# Patient Record
Sex: Female | Born: 1965 | Race: Black or African American | Hispanic: No | Marital: Married | Smoking: Never smoker
Health system: Southern US, Community
[De-identification: ages and names within clinical notes are randomized; demographics above are authoritative.]

## PROBLEM LIST (undated history)

## (undated) DIAGNOSIS — T7840XA Allergy, unspecified, initial encounter: Secondary | ICD-10-CM

## (undated) HISTORY — DX: Allergy, unspecified, initial encounter: T78.40XA

## (undated) HISTORY — PX: OTHER SURGICAL HISTORY: SHX169

---

## 2002-06-18 ENCOUNTER — Other Ambulatory Visit: Admission: RE | Admit: 2002-06-18 | Discharge: 2002-06-18 | Payer: Self-pay | Admitting: Obstetrics and Gynecology

## 2003-11-29 ENCOUNTER — Other Ambulatory Visit: Admission: RE | Admit: 2003-11-29 | Discharge: 2003-11-29 | Payer: Self-pay | Admitting: Pediatrics

## 2012-03-24 ENCOUNTER — Emergency Department (HOSPITAL_COMMUNITY): Payer: BC Managed Care – PPO

## 2012-03-24 ENCOUNTER — Emergency Department (HOSPITAL_COMMUNITY)
Admission: EM | Admit: 2012-03-24 | Discharge: 2012-03-24 | Disposition: A | Discharge status: Home / Self Care | Payer: BC Managed Care – PPO | Attending: Emergency Medicine | Admitting: Emergency Medicine

## 2012-03-24 ENCOUNTER — Encounter (HOSPITAL_COMMUNITY): Payer: Self-pay

## 2012-03-24 DIAGNOSIS — R51 Headache: Secondary | ICD-10-CM | POA: Insufficient documentation

## 2012-03-24 DIAGNOSIS — R42 Dizziness and giddiness: Secondary | ICD-10-CM | POA: Insufficient documentation

## 2012-03-24 DIAGNOSIS — R110 Nausea: Secondary | ICD-10-CM | POA: Insufficient documentation

## 2012-03-24 MED ORDER — OXYMETAZOLINE HCL 0.05 % NA SOLN: 2.0000 | Freq: Two times a day (BID) | NASAL | Status: AC

## 2012-03-24 MED ORDER — FLUTICASONE PROPIONATE 50 MCG/ACT NA SUSP: 2.0000 | Freq: Every day | NASAL | Status: AC

## 2012-03-24 MED ORDER — LORAZEPAM 1 MG PO TABS
1.0000 mg | ORAL_TABLET | Freq: Once | ORAL | Status: AC
  Administered 2012-03-24: 1 mg via ORAL
  Filled 2012-03-24: qty 1

## 2012-03-24 MED ORDER — ONDANSETRON 8 MG PO TBDP: 8.0000 mg | ORAL_TABLET | Freq: Three times a day (TID) | ORAL | Status: AC | PRN

## 2012-03-24 MED ORDER — LORAZEPAM 1 MG PO TABS: 1.0000 mg | ORAL_TABLET | Freq: Three times a day (TID) | ORAL | Status: AC | PRN

## 2012-03-24 MED ORDER — MECLIZINE HCL 25 MG PO TABS
50.0000 mg | ORAL_TABLET | Freq: Once | ORAL | Status: AC
  Administered 2012-03-24: 50 mg via ORAL
  Filled 2012-03-24: qty 2

## 2012-03-24 MED ORDER — ONDANSETRON 4 MG PO TBDP
8.0000 mg | ORAL_TABLET | Freq: Once | ORAL | Status: AC
  Administered 2012-03-24: 8 mg via ORAL
  Filled 2012-03-24: qty 2

## 2012-03-24 NOTE — ED Notes (Signed)
Pt presents with 1 week h/o dizziness, pt describes as a spinning.  +nausea; pt reports "sinus pain".

## 2012-03-24 NOTE — ED Provider Notes (Signed)
Cynthia Gibson is a 46 y.o. female who complains of dizziness, a spinning sensation, ongoing for 2 weeks, with trouble walking. She also has a popping sensation in her sinuses. On exam, she is alert, and cooperative. No truncal ataxia while sitting. No nystagmus. Normal strength and sensation.  Symptoms are nonspecific. I doubt posterior circulation stroke. We'll continue to treat symptomatically and evaluate further with a CT head. I believe that if there is no significant brain problem on the CT scan she can safely  be discharged home. Medical screening examination/treatment/procedure(s) were conducted as a shared visit with non-physician practitioner(s) and myself.  I personally evaluated the patient during the encounter  Flint Melter, MD 03/28/12 1045

## 2012-03-24 NOTE — Discharge Instructions (Signed)
Please read and follow all provided instructions.  Your diagnoses today include:  1. Vertigo     Tests performed today include:  CT head which was normal  Vital signs. See below for your results today.   Medications prescribed:   Ativan - medicine for vertigo  DO NOT drive or perform any activities that require you to be awake and alert because this medicine can make you drowsy.    Zofran (ondansetron) - for nausea and vomiting  Take any prescribed medications only as directed.  Home care instructions:  Follow any educational materials contained in this packet.  Follow-up instructions: Please follow-up with your primary care provider in the next 3 days for further evaluation of your symptoms. If you do not have a primary care doctor -- see below for referral information.   Return instructions:   Please return to the Emergency Department if you experience worsening symptoms.   Please return if you have any other emergent concerns.  Additional Information:  Your vital signs today were: BP 110/73  Pulse 75  Temp 98.7 F (37.1 C) (Oral)  Resp 20  Ht 5\' 7"  (1.702 m)  Wt 173 lb (78.472 kg)  BMI 27.10 kg/m2  SpO2 98%  LMP 03/02/2012 If your blood pressure (BP) was elevated above 135/85 this visit, please have this repeated by your doctor within one month. -------------- No Primary Care Doctor Call [facility]  [phone] Other agencies that provide inexpensive medical care    [facility]  [phone]    [facility]  [phone]    [facility]  [phone]    [facility]  [phone]    [facility]  [phone]    [facility]  [phone] -------------- RESOURCE GUIDE:  Dental Problems  Patients with Medicaid: [facility] [phone]                                            [phone] Phone:  [phone]                                                    Phone:  [phone]  If unable to pay or uninsured, contact:  [facility] or [facility]. to become qualified for the adult dental clinic.  Chronic Pain Problems Contact [facility]  [phone] Patients need to be referred by their primary care doctor.  Insufficient Money for Medicine Contact [employer]:  call "211" or [facility] [phone].  Psychological Services [facility]  [phone] [facility]  [phone] [facility]   [phone] (emergency services [phone])  Substance Abuse Resources [facility]  [phone] [facility] [phone] [facility] [place] [phone] Floydene Flock [phone] Residential & Outpatient Substance Abuse Program  [phone]  Abuse/Neglect [facility] Child Abuse Hotline [phone] [facility] Child Abuse Hotline [phone] (After Hours)  Emergency Shelter [facility] [phone]  Maternity Homes Room at the [place] [facility] [place] [phone] [facility] [phone]  [place]  [employer]  [facility]     [employer]                          [facility] Dept. [address] [place]                       [address]      [address]  Blondell Reveal Phone:  [phone]                                   Phone:  [phone]                 Phone:  [phone]  [facility] Phone:  [phone]  [facility] Child Abuse Hotline [phone] (After Hours)

## 2012-03-24 NOTE — ED Notes (Signed)
Pt. Up to bathroom. States that she vomited x1

## 2012-03-24 NOTE — ED Provider Notes (Signed)
History     CSN: [id number]  Arrival date & time 03/24/12  4098   First MD Initiated Contact with Patient 03/24/12 [id number]      Chief Complaint  Patient presents with  . Dizziness    (Consider location/radiation/quality/duration/timing/severity/associated sxs/prior treatment) HPI Comments: Patient reports complaining of "dizzy spells" described as spinning for the last two weeks that have gotten progressively worse last night and this morning. She reports that last week the spells would come once a day or so and that she could get them under control quickly. Last night, the patient had another spell where she felt very dizzy, lightheaded and nauseous. She reports that she tried to tilt her head back and sit down, but had no relief for over 30 minutes, and decided to go to bed. She woke up this morning with the same dizziness and nausea. She tried to lay back down but again had no relief of the dizziness and nausea for over 30 minutes. She also reports some sinus pressure and headache during the spells. She has not tried any medication for the spells. The patient reports that during the spells she is very unsteady on her feet and feels the need to sit down. Patient denies fever, cough, CP and SOB. She also denies head trauma or loss of consciousness. She does report some numbness and tingling in her extremities during the spells. Patient was seen yesterday by [facility] and diagnosed with a urinary tract infection, but has not filled the prescription for antibiotics yet. Blood was also drawn but she does not know the results. Onset was acute. Course is intermittent. Nothing makes symptoms better or worse.   The history is provided by the patient.    History reviewed. No pertinent past medical history.  History reviewed. No pertinent past surgical history.  No family history on file.  History  Substance Use Topics  . Smoking status: Never Smoker   . Smokeless tobacco: Not on file  . Alcohol  Use: Yes    OB History    Grav Para Term Preterm Abortions TAB SAB Ect Mult Living                  Review of Systems  Constitutional: Negative for fever.  HENT: Positive for sinus pressure. Negative for sore throat and rhinorrhea.   Eyes: Negative for redness.  Respiratory: Negative for cough.   Cardiovascular: Negative for chest pain.  Gastrointestinal: Positive for nausea. Negative for vomiting, abdominal pain and diarrhea.  Genitourinary: Negative for dysuria.  Musculoskeletal: Negative for myalgias.  Skin: Negative for rash.  Neurological: Positive for dizziness (Spinning) and headaches. Negative for syncope, weakness, light-headedness and numbness.    Allergies  Review of patient's allergies indicates no known allergies.  Home Medications   Current Outpatient Rx  Name Route Sig Dispense Refill  . ADULT MULTIVITAMIN W/MINERALS CH Oral Take 1 tablet by mouth daily.      BP 129/68  Pulse 83  Temp 98.3 F (36.8 C) (Oral)  Resp 20  Ht 5\' 7"  (1.702 m)  Wt 173 lb (78.472 kg)  BMI 27.10 kg/m2  SpO2 100%  LMP 03/02/2012  Physical Exam  Nursing note and vitals reviewed. Constitutional: She appears well-developed and well-nourished.  HENT:  Head: Normocephalic and atraumatic.  Right Ear: External ear normal.  Left Ear: External ear normal.  Nose: Nose normal.  Mouth/Throat: Oropharynx is clear and moist. No oropharyngeal exudate.  Eyes: Conjunctivae and EOM are normal. Pupils are equal, round, and reactive  to light. Right eye exhibits no discharge. Left eye exhibits no discharge. Right eye exhibits normal extraocular motion and no nystagmus. Left eye exhibits normal extraocular motion and no nystagmus.  Neck: Normal range of motion. Neck supple.  Cardiovascular: Normal rate, regular rhythm and normal heart sounds.   Pulmonary/Chest: Effort normal and breath sounds normal. No respiratory distress.  Abdominal: Soft. There is no tenderness.  Musculoskeletal: She  exhibits no edema and no tenderness.  Neurological: She is alert. She has normal strength and normal reflexes. No cranial nerve deficit or sensory deficit. Gait (Patient unable to stand without assistance) abnormal. Coordination normal. GCS eye subscore is 4. GCS verbal subscore is 5. GCS motor subscore is 6.  Skin: Skin is warm and dry.  Psychiatric: She has a normal mood and affect.    ED Course  Procedures (including critical care time)  Labs Reviewed - No data to display Ct Head Wo Contrast  03/24/2012  *RADIOLOGY REPORT*  Clinical Data:  Headache and vertigo  CT HEAD WITHOUT CONTRAST  Technique:  Contiguous axial images were obtained from the base of the skull through the vertex without contrast  Comparison:  None.  Findings:  The brain has a normal appearance without evidence for hemorrhage, acute infarction, hydrocephalus, or mass lesion.  There is no extra axial fluid collection.  The skull and paranasal sinuses are normal.  IMPRESSION: Normal CT of the head without contrast.  Original Report Authenticated By: Camelia Phenes, M.D.     1. Vertigo     9:35 AM Patient seen and examined. Medications ordered.   Vital signs reviewed and are as follows: Filed Vitals:   03/24/12 0843  BP: 129/68  Pulse: 83  Temp: 98.3 F (36.8 C)  Resp: 20   1:47 PM Patient has been seen by Dr. Effie Shy. Will CT head given no improvement with meclizine/ativan. Patient continues to have difficulty with ambulation.   CT neg. Symptoms not improved with treatment. Given subjective symptoms, will treat sinusitis. Patient will be given ativan for dizziness. She was counseled on safe use of this medication. Will also give zofran for nausea at patient request.   Neurology referral given for eval of vertigo. Also urged close f/u with PCP and return if symptoms worse or change.   Patient and husband verbalize understanding and agree with plan.    MDM  Vertigo -- do not suspect central vertigo. Minimal  improvement with treatment. Given lack of risk factors, as well as waxing and waning/intermittent severity of severity of symptoms -- do not think this is a posterior circulation event. Do not think MRI is indicated at this time. She is otherwise neurologically intact. She is safe for discharge and follow-up with PCP and neuro.         [place], [place] 03/24/12 1658

## 2012-03-24 NOTE — ED Notes (Signed)
Pt has been experiencing episodes of dizziness and "room spinning". She feels nauseous and has sinus pressure. Also reports she was treated yesterday for UTI but has not picked up antibiotics yet.

## 2012-10-12 ENCOUNTER — Ambulatory Visit: Payer: BC Managed Care – PPO | Admitting: Obstetrics and Gynecology

## 2012-10-12 ENCOUNTER — Encounter: Payer: Self-pay | Admitting: Obstetrics and Gynecology

## 2012-10-12 VITALS — BP 112/64 | HR 68 | Resp 16 | Ht 67.0 in | Wt 180.0 lb

## 2012-10-12 DIAGNOSIS — N3281 Overactive bladder: Secondary | ICD-10-CM | POA: Insufficient documentation

## 2012-10-12 DIAGNOSIS — Z124 Encounter for screening for malignant neoplasm of cervix: Secondary | ICD-10-CM

## 2012-10-12 DIAGNOSIS — Z139 Encounter for screening, unspecified: Secondary | ICD-10-CM

## 2012-10-12 DIAGNOSIS — R350 Frequency of micturition: Secondary | ICD-10-CM

## 2012-10-12 LAB — CBC
HCT: 33.6 % — ABNORMAL LOW (ref 36.0–46.0)
Hemoglobin: 10.9 g/dL — ABNORMAL LOW (ref 12.0–15.0)
MCH: 27.1 pg (ref 26.0–34.0)
MCV: 83.6 fL (ref 78.0–100.0)
RBC: 4.02 MIL/uL (ref 3.87–5.11)
WBC: 4.6 10*3/uL (ref 4.0–10.5)

## 2012-10-12 LAB — POCT URINALYSIS DIPSTICK
Ketones, UA: NEGATIVE
Spec Grav, UA: <=1.005
Urobilinogen, UA: NEGATIVE
pH, UA: 7

## 2012-10-12 NOTE — Progress Notes (Signed)
Contraception Comdom Last pap 05/16/2008 Wnl Last Mammo 2013 WNL Last Colonoscopy None Last Dexa Scan None Primary MD [name] Abuse at Home None  C/o difficulty losing weight and frequency at night  Filed Vitals:   10/12/12 1500  BP: 112/64  Pulse: 68  Resp: 16   ROS: noncontributory  Physical Examination: General appearance - alert, well appearing, and in no distress Neck - supple, no significant adenopathy Chest - clear to auscultation, no wheezes, rales or rhonchi, symmetric air entry Heart - normal rate and regular rhythm Abdomen - soft, nontender, nondistended, no masses or organomegaly Breasts - breasts appear normal, no suspicious masses, no skin or nipple changes or axillary nodes Pelvic - normal external genitalia, vulva, vagina, cervix, uterus and adnexa Back exam - no CVAT Extremities - no edema, redness or tenderness in the calves or thighs  A/P Discussed wt loss Check labs - tsh, cbc and vit d Fasting glucose lab visit only Trial of vesicare RTO 3-26mths to f/u vesicare Kegel exercises

## 2012-10-13 LAB — PAP IG W/ RFLX HPV ASCU

## 2012-10-14 LAB — URINE CULTURE: Colony Count: NO GROWTH

## 2012-10-16 ENCOUNTER — Telehealth: Payer: Self-pay

## 2012-10-16 NOTE — Telephone Encounter (Signed)
Left message for pt to return call. Pt need to start iron supp, low hgb. Mathis Bud

## 2012-10-17 ENCOUNTER — Other Ambulatory Visit: Payer: BC Managed Care – PPO

## 2012-10-17 DIAGNOSIS — Z139 Encounter for screening, unspecified: Secondary | ICD-10-CM

## 2012-10-23 ENCOUNTER — Telehealth: Payer: Self-pay

## 2012-10-23 NOTE — Telephone Encounter (Signed)
LM for pt that all labs looked good except low iron level of 10.9. Iron supplement 325 mg Ferrous sulfate recommended daily. Melody Comas A

## 2013-08-07 IMAGING — CT CT HEAD W/O CM
1 series · 16 of 30 positions shown, 20 images · non-contrast
Comparison: None.

CLINICAL DATA: Headache and vertigo

CT HEAD WITHOUT CONTRAST
TECHNIQUE: Contiguous axial images were obtained from the base of
the skull through the vertex without contrast

[Series 2: head routine 4.8 h37s · axial · 0.43mm/px · z∈[-93,+35]mm · 16 of 30 slices shown, 20 images]
[im 2/30  brain]
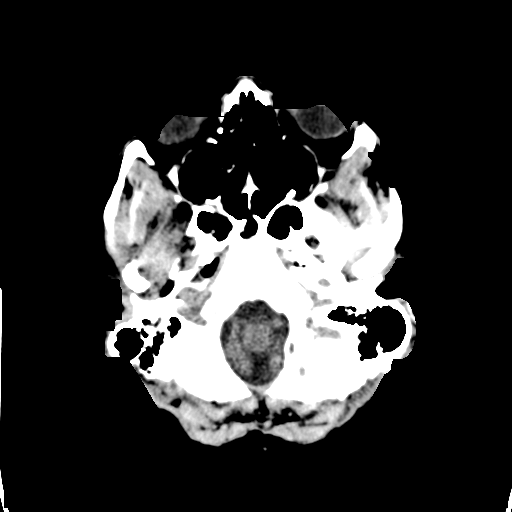
[im 2/30  bone]
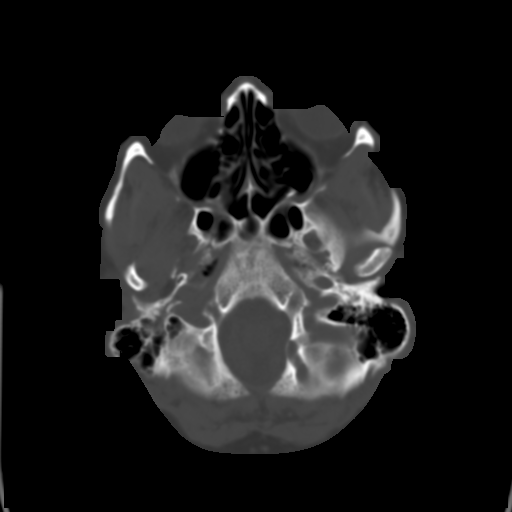
[im 4/30  brain]
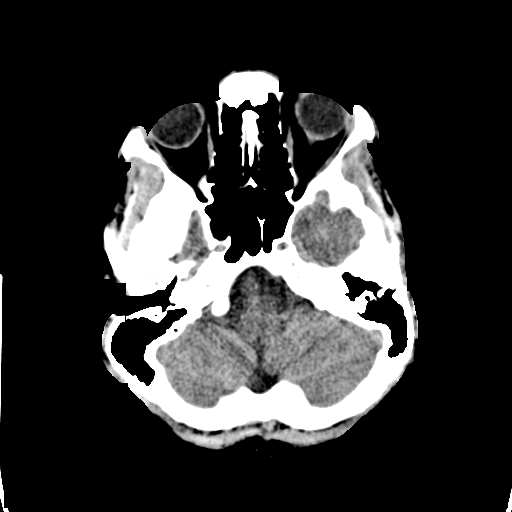
[im 6/30  brain]
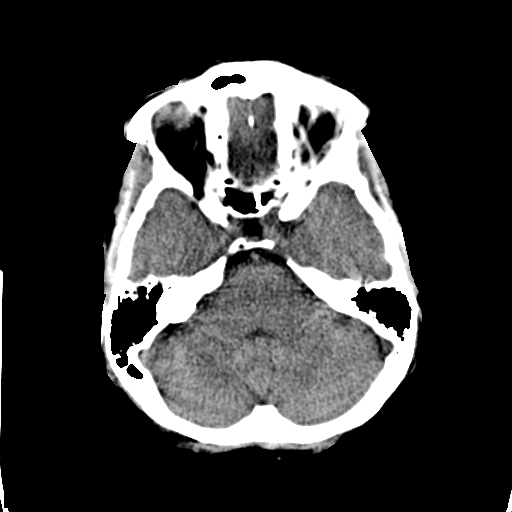
[im 8/30  brain]
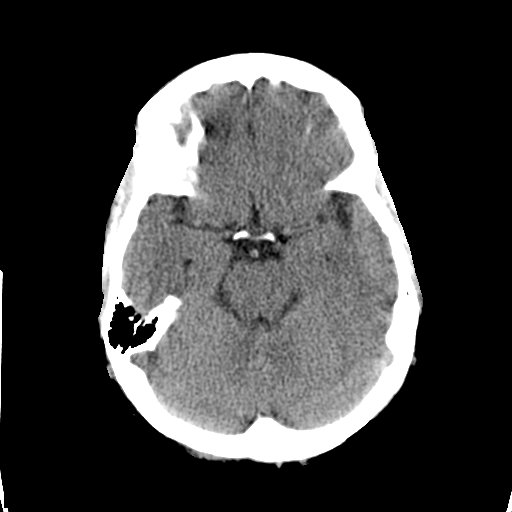
[im 9/30  brain]
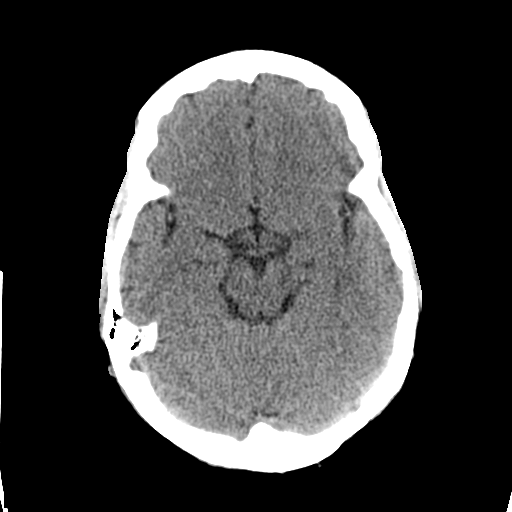
[im 9/30  bone]
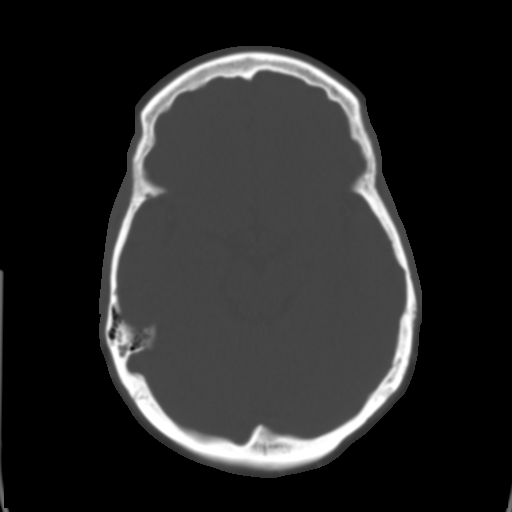
[im 11/30  brain]
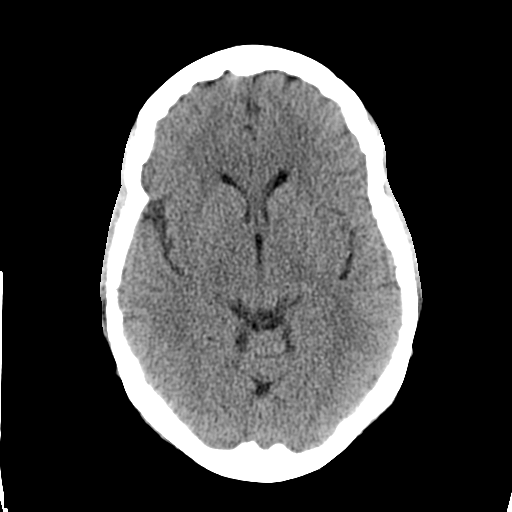
[im 13/30  brain]
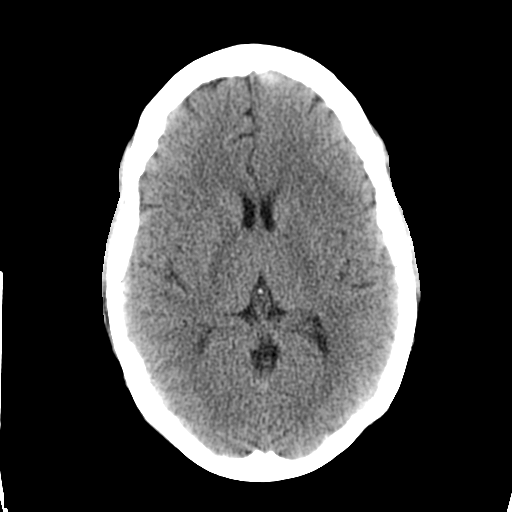
[im 15/30  brain]
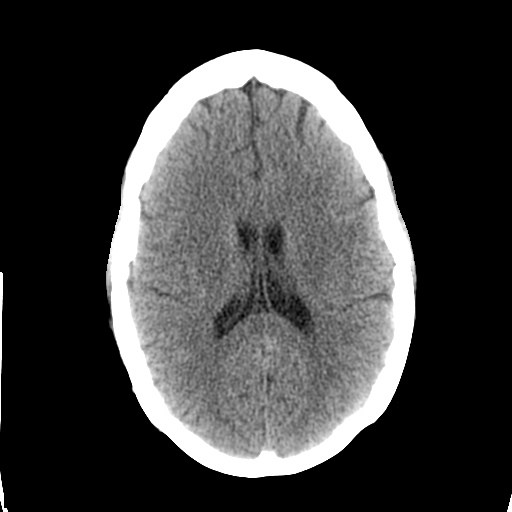
[im 16/30  brain]
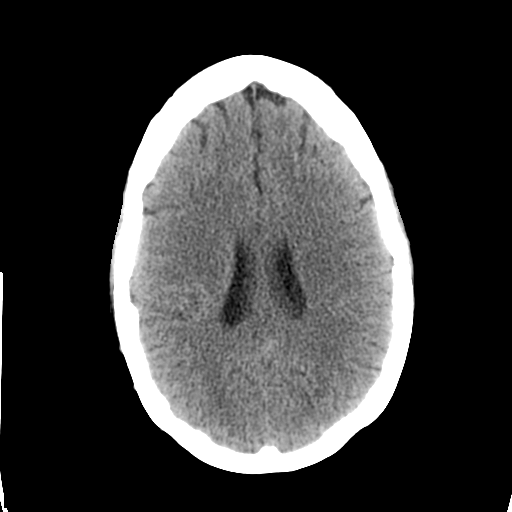
[im 16/30  bone]
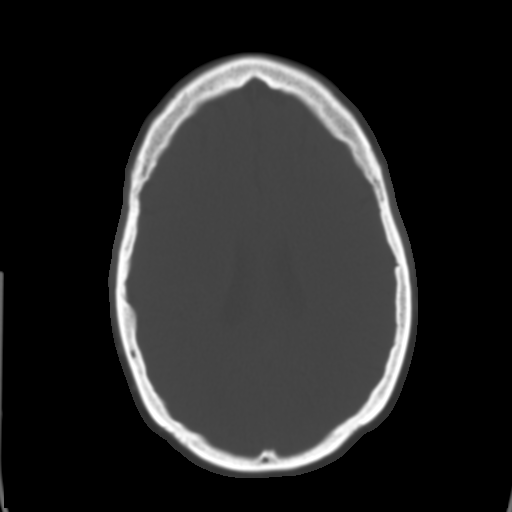
[im 18/30  brain]
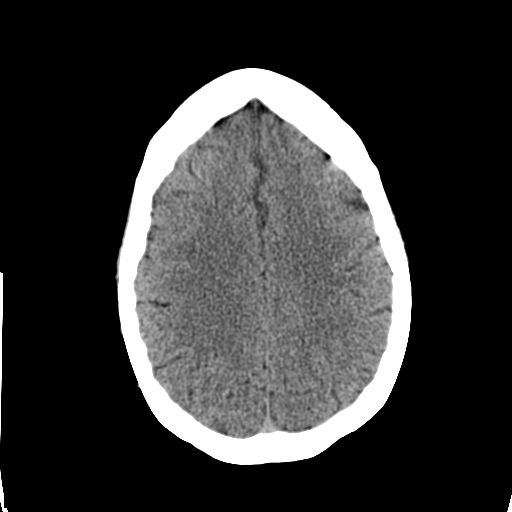
[im 20/30  brain]
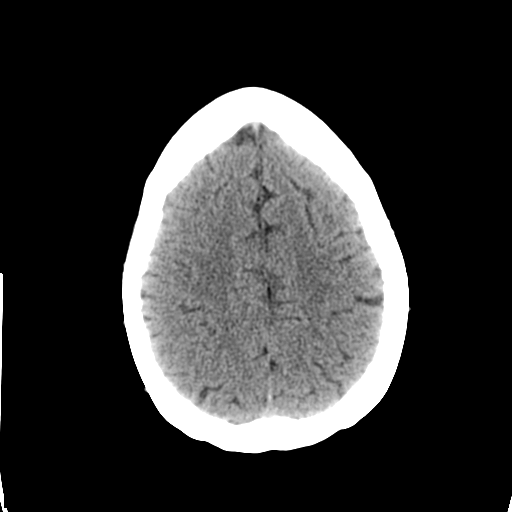
[im 22/30  brain]
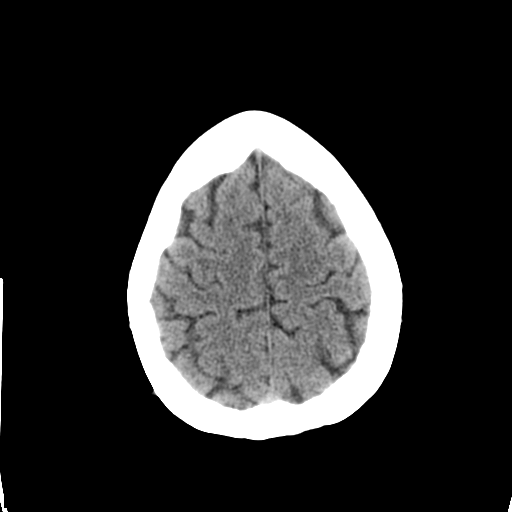
[im 23/30  brain]
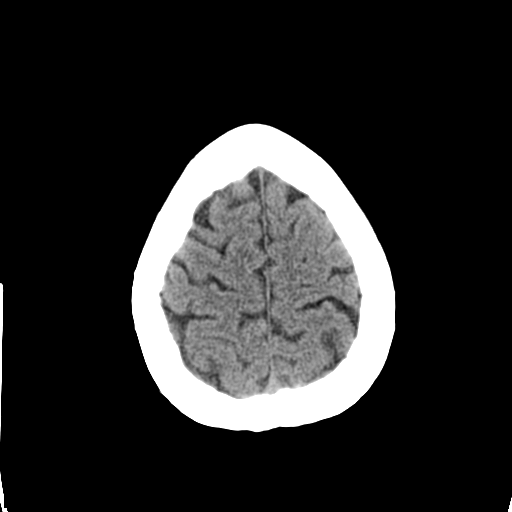
[im 23/30  bone]
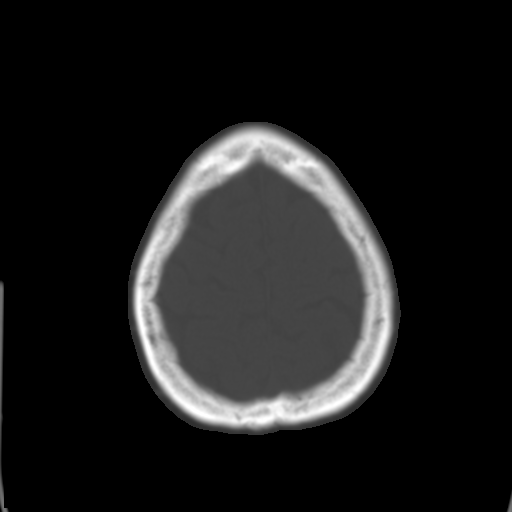
[im 25/30  brain]
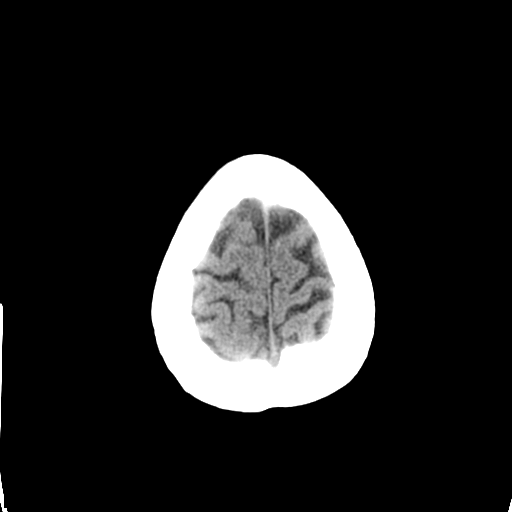
[im 27/30  brain]
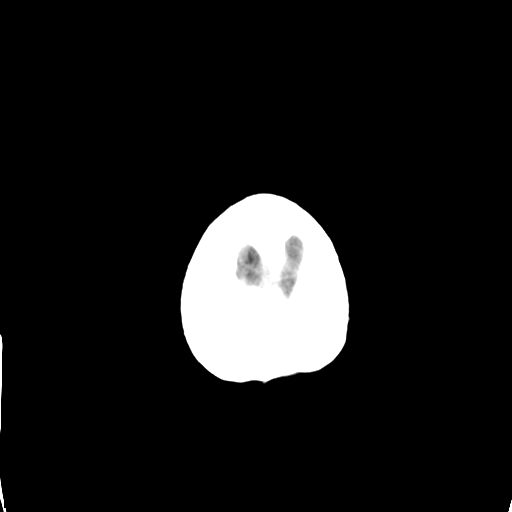
[im 29/30  brain]
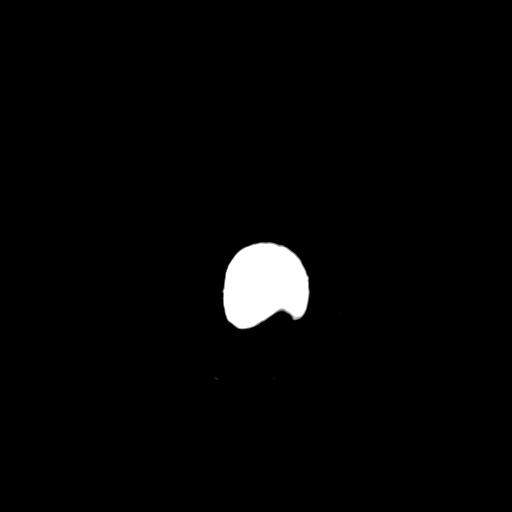

[16 of 30 positions shown; findings below may reference images not displayed]

FINDINGS: The brain has a normal appearance without evidence for
hemorrhage, acute infarction, hydrocephalus, or mass lesion.  There
is no extra axial fluid collection.  The skull and paranasal
sinuses are normal.
IMPRESSION: Normal CT of the head without contrast.

## 2014-07-29 ENCOUNTER — Encounter: Payer: Self-pay | Admitting: Obstetrics and Gynecology

## 2016-10-04 ENCOUNTER — Other Ambulatory Visit: Payer: Self-pay | Admitting: Family Medicine

## 2016-10-04 ENCOUNTER — Other Ambulatory Visit (HOSPITAL_COMMUNITY)
Admission: RE | Admit: 2016-10-04 | Discharge: 2016-10-04 | Disposition: A | Discharge status: Home / Self Care | Payer: BC Managed Care – PPO | Source: Ambulatory Visit | Attending: Family Medicine | Admitting: Family Medicine

## 2016-10-04 DIAGNOSIS — Z01419 Encounter for gynecological examination (general) (routine) without abnormal findings: Secondary | ICD-10-CM | POA: Diagnosis present

## 2016-10-04 DIAGNOSIS — Z1151 Encounter for screening for human papillomavirus (HPV): Secondary | ICD-10-CM | POA: Diagnosis not present

## 2016-10-07 LAB — CYTOLOGY - PAP
DIAGNOSIS: NEGATIVE
HPV (WINDOPATH): NOT DETECTED

## 2019-11-08 ENCOUNTER — Ambulatory Visit: Payer: BC Managed Care – PPO | Attending: Family

## 2019-11-08 DIAGNOSIS — Z23 Encounter for immunization: Secondary | ICD-10-CM | POA: Insufficient documentation

## 2019-11-09 NOTE — Progress Notes (Signed)
   Covid-19 Vaccination Clinic  Name:  Cynthia Gibson    MRN: [id number] DOB: Oct 17, 1965  11/09/2019  [name] was observed post Covid-19 immunization for 15 minutes without incidence. She was provided with Vaccine Information Sheet and instruction to access the V-Safe system.   [name] was instructed to call 911 with any severe reactions post vaccine: Marland Kitchen Difficulty breathing  . Swelling of your face and throat  . A fast heartbeat  . A bad rash all over your body  . Dizziness and weakness    Immunizations Administered    Name Date Dose VIS Date Route   Moderna COVID-19 Vaccine 11/08/2019  5:31 PM 0.5 mL 08/28/2019 Intramuscular   Manufacturer: Moderna   Lot: [id number]   NDC: 76808-811-03

## 2019-12-11 ENCOUNTER — Ambulatory Visit: Payer: BC Managed Care – PPO | Attending: Family

## 2019-12-11 DIAGNOSIS — Z23 Encounter for immunization: Secondary | ICD-10-CM

## 2019-12-11 NOTE — Progress Notes (Signed)
   Covid-19 Vaccination Clinic  Name:  LORYN HAACKE    MRN: [id number] DOB: Jul 18, 1966  12/11/2019  Ms. Pennebaker was observed post Covid-19 immunization for 15 minutes without incident. She was provided with Vaccine Information Sheet and instruction to access the V-Safe system.   Ms. Marcano was instructed to call 911 with any severe reactions post vaccine: Marland Kitchen Difficulty breathing  . Swelling of face and throat  . A fast heartbeat  . A bad rash all over body  . Dizziness and weakness   Immunizations Administered    Name Date Dose VIS Date Route   Moderna COVID-19 Vaccine 12/11/2019  1:08 PM 0.5 mL 08/28/2019 Intramuscular   Manufacturer: Moderna   Lot: [id number]   NDC: 00164-290-37

## 2020-08-18 ENCOUNTER — Ambulatory Visit: Payer: BC Managed Care – PPO | Attending: Family

## 2020-08-18 DIAGNOSIS — Z23 Encounter for immunization: Secondary | ICD-10-CM

## 2020-12-02 NOTE — Progress Notes (Signed)
   Covid-19 Vaccination Clinic  Name:  Cynthia Gibson    MRN: [id number] DOB: 1965/12/28  12/02/2020  [name] was observed post Covid-19 immunization for 15 minutes without incident. She was provided with Vaccine Information Sheet and instruction to access the V-Safe system.   [name] was instructed to call 911 with any severe reactions post vaccine: Marland Kitchen Difficulty breathing  . Swelling of face and throat  . A fast heartbeat  . A bad rash all over body  . Dizziness and weakness   Immunizations Administered    Name Date Dose VIS Date Route   Moderna Covid-19 Booster Vaccine 08/18/2020  2:30 PM 0.25 mL 07/16/2020 Intramuscular   Manufacturer: Moderna   Lot: [id number]   NDC: 62947-654-65

## 2021-09-09 ENCOUNTER — Ambulatory Visit: Payer: Self-pay | Attending: Family

## 2021-09-09 DIAGNOSIS — Z23 Encounter for immunization: Secondary | ICD-10-CM

## 2021-09-09 NOTE — Progress Notes (Signed)
° °  Covid-19 Vaccination Clinic  Name:  WINRY EGNEW    MRN: [id number] DOB: 1966-07-24  09/09/2021  Ms. Hansell was observed post Covid-19 immunization for 15 minutes without incident. She was provided with Vaccine Information Sheet and instruction to access the V-Safe system.   Ms. Ericson was instructed to call 911 with any severe reactions post vaccine: Difficulty breathing  Swelling of face and throat  A fast heartbeat  A bad rash all over body  Dizziness and weakness   Immunizations Administered     Name Date Dose VIS Date Route   Moderna Covid-19 vaccine Bivalent Booster 09/09/2021  3:00 PM 0.5 mL 05/09/2021 Intramuscular   Manufacturer: Moderna   Lot: [id number]   NDC: 89791-504-13

## 2023-10-17 ENCOUNTER — Other Ambulatory Visit: Payer: Self-pay

## 2023-10-17 DIAGNOSIS — I872 Venous insufficiency (chronic) (peripheral): Secondary | ICD-10-CM

## 2023-10-26 NOTE — Progress Notes (Unsigned)
Office Note     CC:  Varicose veins Requesting Provider:  Jamey Reas, P*  HPI: Cynthia Gibson is a 58 y.o. (11/07/1965) female presenting at the request of .[name], MD for bilateral lower extremity varicose veins.  On exam, due to [place] was doing well.  Originally from [place], she moved down with family decades ago.  She has worked in [employer] for the last 30 years.  She has 2 children, both of which are also working [employer].  She has appreciated spider veins on her lower extremities for decades.  She denies significant edema, pain, heaviness.  Her legs look the same at the end of the day as they do in the morning.  Denies bleeding event, ulceration.  Denies pain to palpation.  In essence, [name] does not like the site of her telangiectasias, and would like to have them removed.     No past medical history on file.  Past Surgical History:  Procedure Laterality Date   Bilateral hand surgery  1966    Social History   Socioeconomic History   Marital status: Married    Spouse name: Not on file   Number of children: Not on file   Years of education: Not on file   Highest education level: Not on file  Occupational History   Not on file  Tobacco Use   Smoking status: Never   Smokeless tobacco: Not on file  Substance and Sexual Activity   Alcohol use: Yes   Drug use: No   Sexual activity: Yes    Birth control/protection: Condom  Other Topics Concern   Not on file  Social History Narrative   Not on file   Social Drivers of Health   Financial Resource Strain: Not on file  Food Insecurity: Not on file  Transportation Needs: Not on file  Physical Activity: Not on file  Stress: Not on file  Social Connections: Not on file  Intimate Partner Violence: Not on file   Family History  Problem Relation Age of Onset   Cancer Father     Current Outpatient Medications  Medication Sig Dispense Refill   fluticasone (FLONASE) 50 MCG/ACT nasal spray Place 2  sprays into the nose daily. 16 g 0   Multiple Vitamin (MULTIVITAMIN WITH MINERALS) TABS Take 1 tablet by mouth daily.     No current facility-administered medications for this visit.    No Known Allergies   REVIEW OF SYSTEMS:   [X]  denotes positive finding, [ ]  denotes negative finding Cardiac  Comments:  Chest pain or chest pressure:    Shortness of breath upon exertion:    Short of breath when lying flat:    Irregular heart rhythm:        Vascular    Pain in calf, thigh, or hip brought on by ambulation:    Pain in feet at night that wakes you up from your sleep:     Blood clot in your veins:    Leg swelling:         Pulmonary    Oxygen at home:    Productive cough:     Wheezing:         Neurologic    Sudden weakness in arms or legs:     Sudden numbness in arms or legs:     Sudden onset of difficulty speaking or slurred speech:    Temporary loss of vision in one eye:     Problems with dizziness:  Gastrointestinal    Blood in stool:     Vomited blood:         Genitourinary    Burning when urinating:     Blood in urine:        Psychiatric    Major depression:         Hematologic    Bleeding problems:    Problems with blood clotting too easily:        Skin    Rashes or ulcers:        Constitutional    Fever or chills:      PHYSICAL EXAMINATION:  There were no vitals filed for this visit.  General:  WDWN in NAD; vital signs documented above Gait: Not observed HENT: WNL, normocephalic Pulmonary: normal non-labored breathing , without wheezing Cardiac: regular HR Abdomen: soft, NT, no masses Skin: without rashes Vascular Exam/Pulses:  Right Left  Radial 2+ (normal) 2+ (normal)  Ulnar    Femoral    Popliteal    DP 2+ (normal) 2+ (normal)  PT     Extremities: without ischemic changes, without Gangrene , without cellulitis; without open wounds;  Musculoskeletal: no muscle wasting or atrophy  Neurologic: A&O X 3;  No focal weakness or  paresthesias are detected Psychiatric:  The pt has Normal affect.   Non-Invasive Vascular Imaging:     +--------------+---------+------+-----------+------------+--------+  LEFT         Reflux NoRefluxReflux TimeDiameter cmsComments                          Yes                                   +--------------+---------+------+-----------+------------+--------+  CFV                    yes   >1 second                       +--------------+---------+------+-----------+------------+--------+  FV mid        no                                              +--------------+---------+------+-----------+------------+--------+  Popliteal    no                                              +--------------+---------+------+-----------+------------+--------+  GSV at SFJ              yes    >500 ms      .532              +--------------+---------+------+-----------+------------+--------+  GSV prox thigh                              .315              +--------------+---------+------+-----------+------------+--------+  GSV mid thigh                               .232              +--------------+---------+------+-----------+------------+--------+  GSV dist thigh                              .210              +--------------+---------+------+-----------+------------+--------+  GSV at knee                                 .235              +--------------+---------+------+-----------+------------+--------+  GSV prox calf           yes    >500 ms      .253              +--------------+---------+------+-----------+------------+--------+  GSV mid calf            yes    >500 ms      .253              +--------------+---------+------+-----------+------------+--------+  SSV Pop Fossa no                            .246              +--------------+---------+------+-----------+------------+--------+  SSV prox calf no                             .171              +--------------+---------+------+-----------+------------+--------+      ASSESSMENT/PLAN: [name] is a 58 y.o. female presenting with bilateral lower extremity telangiectasias that she would like to have removed.  Venous reflux demonstrates some focal valvular dysfunction, however it does not involve the entirety of the greater saphenous vein.  Furthermore, she is asymptomatic, and therefore would not benefit from ablation.  She would benefit from bilateral lower extremity compression and elevation when able.  She was fitted for compression stockings during her visit today.  During her visit, I had her meet with our nurse who performs sclerotherapy.  The risks and benefits were discussed.  Kandis can follow-up with me as needed at this time.  Victorino Sparrow, MD [facility] [phone]

## 2023-10-27 ENCOUNTER — Ambulatory Visit: Payer: 59 | Admitting: Vascular Surgery

## 2023-10-27 ENCOUNTER — Encounter: Payer: Self-pay | Admitting: Vascular Surgery

## 2023-10-27 ENCOUNTER — Ambulatory Visit (HOSPITAL_COMMUNITY)
Admission: RE | Admit: 2023-10-27 | Discharge: 2023-10-27 | Disposition: A | Discharge status: Home / Self Care | Payer: 59 | Source: Ambulatory Visit | Attending: Vascular Surgery | Admitting: Vascular Surgery

## 2023-10-27 VITALS — BP 140/83 | HR 79 | Temp 98.1°F | Ht 67.0 in | Wt 214.0 lb

## 2023-10-27 DIAGNOSIS — I8393 Asymptomatic varicose veins of bilateral lower extremities: Secondary | ICD-10-CM | POA: Diagnosis not present

## 2023-10-27 DIAGNOSIS — I872 Venous insufficiency (chronic) (peripheral): Secondary | ICD-10-CM | POA: Insufficient documentation

## 2024-01-19 ENCOUNTER — Encounter: Payer: Self-pay | Admitting: Radiology
